# Patient Record
Sex: Male | Born: 2000 | Race: White | Hispanic: No | Marital: Single | Smoking: Current some day smoker
Health system: Southern US, Community
[De-identification: ages and names within clinical notes are randomized; demographics above are authoritative.]

## PROBLEM LIST (undated history)

## (undated) DIAGNOSIS — M419 Scoliosis, unspecified: Secondary | ICD-10-CM

## (undated) DIAGNOSIS — F32A Depression, unspecified: Secondary | ICD-10-CM

## (undated) DIAGNOSIS — Q8719 Other congenital malformation syndromes predominantly associated with short stature: Secondary | ICD-10-CM

## (undated) DIAGNOSIS — K219 Gastro-esophageal reflux disease without esophagitis: Secondary | ICD-10-CM

## (undated) DIAGNOSIS — F419 Anxiety disorder, unspecified: Secondary | ICD-10-CM

## (undated) DIAGNOSIS — F909 Attention-deficit hyperactivity disorder, unspecified type: Secondary | ICD-10-CM

## (undated) DIAGNOSIS — R5382 Chronic fatigue, unspecified: Secondary | ICD-10-CM

## (undated) DIAGNOSIS — G2581 Restless legs syndrome: Secondary | ICD-10-CM

## (undated) HISTORY — DX: Chronic fatigue, unspecified: R53.82

## (undated) HISTORY — DX: Scoliosis, unspecified: M41.9

## (undated) HISTORY — DX: Other congenital malformation syndromes predominantly associated with short stature: Q87.19

## (undated) HISTORY — DX: Depression, unspecified: F32.A

## (undated) HISTORY — DX: Restless legs syndrome: G25.81

## (undated) HISTORY — DX: Attention-deficit hyperactivity disorder, unspecified type: F90.9

## (undated) HISTORY — DX: Gastro-esophageal reflux disease without esophagitis: K21.9

## (undated) HISTORY — DX: Anxiety disorder, unspecified: F41.9

## (undated) HISTORY — PX: WISDOM TOOTH EXTRACTION: SHX21

---
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
## 2019-10-31 ENCOUNTER — Ambulatory Visit: Payer: Medicaid Other | Attending: Internal Medicine

## 2019-10-31 DIAGNOSIS — Z23 Encounter for immunization: Secondary | ICD-10-CM

## 2019-10-31 NOTE — Progress Notes (Signed)
   Covid-19 Vaccination Clinic  Name:  Scott Perkins    MRN: [id number] DOB: 2000/10/09  10/31/2019  Scott Perkins was observed post Covid-19 immunization for 15 minutes without incident. He was provided with Vaccine Information Sheet and instruction to access the V-Safe system.   Scott Perkins was instructed to call 911 with any severe reactions post vaccine: Marland Kitchen Difficulty breathing  . Swelling of face and throat  . A fast heartbeat  . A bad rash all over body  . Dizziness and weakness   Immunizations Administered    Name Date Dose VIS Date Route   Pfizer COVID-19 Vaccine 10/31/2019  9:02 AM 0.3 mL 08/13/2018 Intramuscular   Manufacturer: [employer], [employer]   Lot: [id number]   NDC: [address]-5

## 2019-11-25 ENCOUNTER — Ambulatory Visit: Payer: Medicaid Other | Attending: Internal Medicine

## 2019-11-25 DIAGNOSIS — Z23 Encounter for immunization: Secondary | ICD-10-CM

## 2019-11-25 NOTE — Progress Notes (Signed)
   Covid-19 Vaccination Clinic  Name:  Scott Perkins    MRN: [id number] DOB: 2000/09/07  11/25/2019  [name] was observed post Covid-19 immunization for 15 minutes without incident. He was provided with Vaccine Information Sheet and instruction to access the V-Safe system.   [name] was instructed to call 911 with any severe reactions post vaccine: Marland Kitchen Difficulty breathing  . Swelling of face and throat  . A fast heartbeat  . A bad rash all over body  . Dizziness and weakness   Immunizations Administered    Name Date Dose VIS Date Route   Pfizer COVID-19 Vaccine 11/25/2019 10:59 AM 0.3 mL 08/13/2018 Intramuscular   Manufacturer: [employer], [employer]   Lot: [id number]   NDC: [address]-2

## 2021-06-27 ENCOUNTER — Encounter: Payer: Self-pay | Admitting: Nurse Practitioner

## 2021-06-27 ENCOUNTER — Ambulatory Visit (INDEPENDENT_AMBULATORY_CARE_PROVIDER_SITE_OTHER): Payer: 59 | Admitting: Nurse Practitioner

## 2021-06-27 ENCOUNTER — Other Ambulatory Visit: Payer: Self-pay

## 2021-06-27 VITALS — BP 110/64 | HR 92 | Temp 97.9°F | Ht 67.2 in | Wt 94.6 lb

## 2021-06-27 DIAGNOSIS — Q8719 Other congenital malformation syndromes predominantly associated with short stature: Secondary | ICD-10-CM

## 2021-06-27 DIAGNOSIS — Z79899 Other long term (current) drug therapy: Secondary | ICD-10-CM

## 2021-06-27 DIAGNOSIS — F419 Anxiety disorder, unspecified: Secondary | ICD-10-CM | POA: Diagnosis not present

## 2021-06-27 DIAGNOSIS — F1290 Cannabis use, unspecified, uncomplicated: Secondary | ICD-10-CM

## 2021-06-27 DIAGNOSIS — F3289 Other specified depressive episodes: Secondary | ICD-10-CM

## 2021-06-27 DIAGNOSIS — F900 Attention-deficit hyperactivity disorder, predominantly inattentive type: Secondary | ICD-10-CM | POA: Diagnosis not present

## 2021-06-27 DIAGNOSIS — R21 Rash and other nonspecific skin eruption: Secondary | ICD-10-CM

## 2021-06-27 DIAGNOSIS — Z23 Encounter for immunization: Secondary | ICD-10-CM | POA: Diagnosis not present

## 2021-06-27 DIAGNOSIS — Z7689 Persons encountering health services in other specified circumstances: Secondary | ICD-10-CM

## 2021-06-27 MED ORDER — VALACYCLOVIR HCL 500 MG PO TABS: 500.0000 mg | ORAL_TABLET | Freq: Two times a day (BID) | ORAL | 0 refills | Status: AC

## 2021-06-27 MED ORDER — TETANUS-DIPHTH-ACELL PERTUSSIS 5-2.5-18.5 LF-MCG/0.5 IM SUSY
0.5000 mL | PREFILLED_SYRINGE | Freq: Once | INTRAMUSCULAR | Status: AC
  Administered 2021-06-27: 0.5 mL via INTRAMUSCULAR

## 2021-06-27 NOTE — Progress Notes (Signed)
I,Scott Perkins,acting as a Education administrator for Scott Brine, FNP.,have documented all relevant documentation on the behalf of Scott Brine, FNP,as directed by  Scott Brine, FNP while in the presence of Scott Perkins, Scott Perkins.   This visit occurred during the SARS-CoV-2 public health emergency.  Safety protocols were in place, including screening questions prior to the visit, additional usage of staff PPE, and extensive cleaning of exam room while observing appropriate contact time as indicated for disinfecting solutions.  Subjective:     Patient ID: Scott Perkins , male    DOB: August 16, 2000 , 21 y.o.   MRN: [id number]   Chief Complaint  Patient presents with   Establish Care   Rash   ADHD    HPI  Patient presents today to establish primary care. He has not seen a PCP in 2-3 years the last time was with a pediatrician Dr. Jaynie Crumble at [facility]. He is working at [employer] in [place]. His mother is a patient her M.H. single. No children.  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 he has problems with gaining weight, reports having a low appetite. He had seen a specialist at [facility] in the past. Diagnosed with ADHD Kindergarten or first grade, he had been given a blood thinner and blacked out then had problems with ADHD - adderal 10 mg XR, abilify and prozac. No longer seeing a psychiatrist due to provider moving to [place]. He continued his through [facility].   He would like to be seen for a rash and he also needs a refill on ADHD med.   Wt Readings from Last 3 Encounters: 06/27/21 : 94 lb 9.6 oz (42.9 kg)    No current sexual activity, last time was 2 years ago  Rash This is a chronic problem. The current episode started more than 1 year ago. The problem is unchanged. The affected locations include the left upper leg and right upper leg. The rash is characterized by redness, itchiness, scaling and burning. He was exposed to  nothing. Past treatments include nothing.    Past Medical History:  Diagnosis Date   ADHD (attention deficit hyperactivity disorder)    Anxiety    Chronic fatigue    Depression    GERD (gastroesophageal reflux disease)    Restless leg syndrome    Russell-Silver syndrome    Scoliosis      Family History  Problem Relation Age of Onset   Lupus Mother    Diabetes Maternal Grandmother    Fibromyalgia Maternal Grandmother      Current Outpatient Medications:    amphetamine-dextroamphetamine (ADDERALL XR) 10 MG 24 hr capsule, Adderall XR 10 mg capsule,extended release  TAKE ONE CAPSULE BY MOUTH QD, Disp: , Rfl:    ARIPiprazole (ABILIFY) 5 MG tablet, aripiprazole 5 mg tablet, Disp: , Rfl:    valACYclovir (VALTREX) 500 MG tablet, Take 1 tablet (500 mg total) by mouth 2 (two) times daily for 5 days., Disp: 10 tablet, Rfl: 0   FLUoxetine (PROZAC) 20 MG capsule, fluoxetine 20 mg capsule, Disp: 90 capsule, Rfl: 1   Not on File   Review of Systems  Constitutional: Negative.   Respiratory: Negative.    Cardiovascular: Negative.   Skin:  Positive for rash (from right thigh across to left thigh).  Psychiatric/Behavioral: Negative.      Today's Vitals   06/27/21 1535 06/27/21 1657  BP: 94/80 110/64  Pulse: 92   Temp: 97.9 F (36.6 C)  Weight: 94 lb 9.6 oz (42.9 kg)   Height: 5' 7.2" (1.707 m)   PainSc: 3    PainLoc: Back    Body mass index is 14.73 kg/m.   Objective:  Physical Exam Vitals reviewed.  Constitutional:      General: He is not in acute distress.    Appearance: Normal appearance.     Comments: Thin  Cardiovascular:     Rate and Rhythm: Normal rate.     Pulses: Normal pulses.     Heart sounds: Normal heart sounds. No murmur heard. Pulmonary:     Effort: Pulmonary effort is normal. No respiratory distress.     Breath sounds: Normal breath sounds. No wheezing.  Skin:    General: Skin is warm and dry.     Comments: Has ulcerations to right inner thigh and mid  area underneath scrotal area.   Neurological:     General: No focal deficit present.     Mental Status: He is alert and oriented to person, place, and time.     Cranial Nerves: No cranial nerve deficit.     Motor: No weakness.  Psychiatric:        Mood and Affect: Mood normal.        Behavior: Behavior normal.        Thought Content: Thought content normal.        Judgment: Judgment normal.        Assessment And Plan:     1. Rash and nonspecific skin eruption Comments: Ulcerations present to left inner buttocks and between groin under scrotum.  - CMP14+EGFR - HSV(herpes simplex vrs) 1+2 ab-IgG  2. Russell-Silver syndrome Comments: Reports having a thin and small stature, challenges with gaining weight. Stopped taking growth hormones at age 62.  3. Attention deficit hyperactivity disorder (ADHD), predominantly inattentive type Comments: Pending urine drug screen will start adderal. Will also refer to psychiatry due to reporting impulsive behavior.  - CMP14+EGFR - Drug Profile, Ur, 9 Drugs - Ambulatory referral to Psychiatry  4. Anxiety - Drug Profile, Ur, 9 Drugs  5. Other depression - Drug Profile, Ur, 9 Drugs  6. Marijuana use Comments: Admits to occasional use. I have advised him some of the medications for his mood/behaviors can be affected by this and encouraged to quit  7. Other long term (current) drug therapy  8. Encounter for immunization - Flu Vaccine QUAD 6+ mos PF IM (Fluarix Quad PF) - Tdap (BOOSTRIX) injection 0.5 mL  9. Establishing care with new doctor, encounter for     Patient was given opportunity to ask questions. Patient verbalized understanding of the plan and was able to repeat key elements of the plan. All questions were answered to their satisfaction.  Scott Brine, FNP   I, Scott Brine, FNP, have reviewed all documentation for this visit. The documentation on 06/28/21 for the exam, diagnosis, procedures, and orders are all accurate and  complete.   IF YOU HAVE BEEN REFERRED TO A SPECIALIST, IT MAY TAKE 1-2 WEEKS TO SCHEDULE/PROCESS THE REFERRAL. IF YOU HAVE NOT HEARD FROM US/SPECIALIST IN TWO WEEKS, PLEASE GIVE [place] A CALL AT [phone] X [phone].   THE PATIENT IS ENCOURAGED TO PRACTICE SOCIAL DISTANCING DUE TO THE COVID-19 PANDEMIC.

## 2021-06-28 ENCOUNTER — Encounter: Payer: Self-pay | Admitting: Nurse Practitioner

## 2021-06-28 LAB — CMP14+EGFR
ALT: 16 IU/L (ref 0–44)
AST: 23 IU/L (ref 0–40)
Albumin/Globulin Ratio: 2.3 — ABNORMAL HIGH (ref 1.2–2.2)
Albumin: 5.4 g/dL — ABNORMAL HIGH (ref 4.1–5.2)
Alkaline Phosphatase: 94 IU/L (ref 44–121)
BUN/Creatinine Ratio: 11 (ref 9–20)
BUN: 12 mg/dL (ref 6–20)
Bilirubin Total: 0.3 mg/dL (ref 0.0–1.2)
CO2: 23 mmol/L (ref 20–29)
Calcium: 10 mg/dL (ref 8.7–10.2)
Chloride: 106 mmol/L (ref 96–106)
Creatinine, Ser: 1.07 mg/dL (ref 0.76–1.27)
Globulin, Total: 2.4 g/dL (ref 1.5–4.5)
Glucose: 91 mg/dL (ref 70–99)
Potassium: 4.5 mmol/L (ref 3.5–5.2)
Sodium: 144 mmol/L (ref 134–144)
Total Protein: 7.8 g/dL (ref 6.0–8.5)
eGFR: 101 mL/min/{1.73_m2} (ref >59)

## 2021-06-28 LAB — HSV(HERPES SIMPLEX VRS) I + II AB-IGG
HSV 1 Glycoprotein G Ab, IgG: <0.91 index (ref 0.00–0.90)
HSV 2 IgG, Type Spec: <0.91 index (ref 0.00–0.90)

## 2021-06-28 MED ORDER — FLUOXETINE HCL 20 MG PO CAPS: ORAL_CAPSULE | ORAL | 1 refills | Status: DC

## 2021-06-30 ENCOUNTER — Other Ambulatory Visit: Payer: Self-pay

## 2021-06-30 MED ORDER — FLUOXETINE HCL 20 MG PO CAPS: 20.0000 mg | ORAL_CAPSULE | Freq: Every day | ORAL | 1 refills | Status: AC

## 2021-07-03 LAB — DRUG PROFILE, UR, 9 DRUGS (LABCORP)
Amphetamines, Urine: NEGATIVE ng/mL
Barbiturate Quant, Ur: NEGATIVE ng/mL
Benzodiazepine Quant, Ur: NEGATIVE ng/mL
Cannabinoid Quant, Ur: NEGATIVE
Cocaine (Metab.): NEGATIVE ng/mL
Methadone Screen, Urine: NEGATIVE ng/mL
Opiate Quant, Ur: NEGATIVE ng/mL
PCP Quant, Ur: NEGATIVE ng/mL
Propoxyphene: NEGATIVE ng/mL

## 2021-10-13 ENCOUNTER — Encounter: Payer: Self-pay | Admitting: Nurse Practitioner

## 2021-10-19 ENCOUNTER — Ambulatory Visit (INDEPENDENT_AMBULATORY_CARE_PROVIDER_SITE_OTHER): Payer: 59 | Admitting: Nurse Practitioner

## 2021-10-19 ENCOUNTER — Encounter: Payer: Self-pay | Admitting: Nurse Practitioner

## 2021-10-19 VITALS — BP 108/60 | HR 82 | Temp 98.2°F | Ht 67.2 in | Wt 101.0 lb

## 2021-10-19 DIAGNOSIS — F900 Attention-deficit hyperactivity disorder, predominantly inattentive type: Secondary | ICD-10-CM | POA: Diagnosis not present

## 2021-10-19 DIAGNOSIS — Z0000 Encounter for general adult medical examination without abnormal findings: Secondary | ICD-10-CM | POA: Diagnosis not present

## 2021-10-19 DIAGNOSIS — Z1159 Encounter for screening for other viral diseases: Secondary | ICD-10-CM

## 2021-10-19 DIAGNOSIS — Z114 Encounter for screening for human immunodeficiency virus [HIV]: Secondary | ICD-10-CM

## 2021-10-19 DIAGNOSIS — F419 Anxiety disorder, unspecified: Secondary | ICD-10-CM

## 2021-10-19 DIAGNOSIS — R21 Rash and other nonspecific skin eruption: Secondary | ICD-10-CM

## 2021-10-19 DIAGNOSIS — F3289 Other specified depressive episodes: Secondary | ICD-10-CM | POA: Diagnosis not present

## 2021-10-19 NOTE — Progress Notes (Signed)
?Industrial/product designer as a Education administrator for [employer], FNP.,have documented all relevant documentation on the behalf of Minette Brine, FNP,as directed by  Minette Brine, FNP while in the presence of Minette Brine, [place]. ? ?This visit occurred during the SARS-CoV-2 public health emergency.  Safety protocols were in place, including screening questions prior to the visit, additional usage of staff PPE, and extensive cleaning of exam room while observing appropriate contact time as indicated for disinfecting solutions. ? ?Subjective:  ?  ? Patient ID: Scott Perkins , male    DOB: October 06, 2000 , 21 y.o.   MRN: [id number] ? ? ?Chief Complaint  ?Patient presents with  ? Annual Exam  ? ? ?HPI ? ?Patient presents for HM. Has not seen any other providers. He is not exercising regularly. Regular diet, tries to avoid eating high amounts of junk food. He has not picked up his prozac for depression.  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 He has a full time job at [employer]. He feels like his stressors are less.  ? ?Wt Readings from Last 3 Encounters: ?10/19/21 : 101 lb (45.8 kg) ?06/27/21 : 94 lb 9.6 oz (42.9 kg) ? ? ? ?  ? ?Past Medical History:  ?Diagnosis Date  ? ADHD (attention deficit hyperactivity disorder)   ? Anxiety   ? Chronic fatigue   ? Depression   ? GERD (gastroesophageal reflux disease)   ? Restless leg syndrome   ? Russell-Silver syndrome   ? Scoliosis   ?  ? ?Family History  ?Problem Relation Age of Onset  ? Lupus Mother   ? Diabetes Maternal Grandmother   ? Fibromyalgia Maternal Grandmother   ? ? ? ?Current Outpatient Medications:  ?  FLUoxetine (PROZAC) 20 MG capsule, Take 1 capsule (20 mg total) by mouth daily. fluoxetine 20 mg capsule, Disp: 90 capsule, Rfl: 1  ? ?Allergies  ?Allergen Reactions  ? Sudafed [Pseudoephedrine]   ?  ? ?Review of Systems  ?Constitutional: Negative.   ?HENT: Negative.    ?Eyes: Negative.   ?Respiratory: Negative.    ?Cardiovascular: Negative.   ?Gastrointestinal: Negative.    ?Endocrine: Negative.   ?Genitourinary: Negative.   ?Musculoskeletal: Negative.   ?Skin: Negative.   ?Allergic/Immunologic: Negative.   ?Neurological: Negative.   ?Hematological: Negative.   ?Psychiatric/Behavioral: Negative.     ? ?Today's Vitals  ? 10/19/21 1504  ?BP: 108/60  ?Pulse: 82  ?Temp: 98.2 ?F (36.8 ?C)  ?TempSrc: Oral  ?Weight: 101 lb (45.8 kg)  ?Height: 5' 7.2" (1.707 m)  ? ?Body mass index is 15.72 kg/m?.  ?Wt Readings from Last 3 Encounters:  ?10/19/21 101 lb (45.8 kg)  ?06/27/21 94 lb 9.6 oz (42.9 kg)  ? ? ?Objective:  ?Physical Exam ?Vitals reviewed.  ?Constitutional:   ?   General: He is not in acute distress. ?   Appearance: Normal appearance.  ?   Comments: Thin  ?HENT:  ?   Head: Normocephalic and atraumatic.  ?   Right Ear: Tympanic membrane, ear canal and external ear normal. There is no impacted cerumen.  ?   Left Ear: Tympanic membrane, ear canal and external ear normal. There is no impacted cerumen.  ?   Nose: Nose normal.  ?   Mouth/Throat:  ?   Mouth: Mucous membranes are moist.  ?Eyes:  ?   Pupils: Pupils are equal, round, and reactive to light.  ?Cardiovascular:  ?   Rate and Rhythm: Normal rate and regular rhythm.  ?   Pulses: Normal pulses.  ?  Heart sounds: Normal heart sounds. No murmur heard. ?Pulmonary:  ?   Effort: Pulmonary effort is normal. No respiratory distress.  ?   Breath sounds: Normal breath sounds. No wheezing.  ?Abdominal:  ?   General: Abdomen is flat. Bowel sounds are normal. There is no distension.  ?   Palpations: Abdomen is soft.  ?Musculoskeletal:     ?   General: No swelling or tenderness. Normal range of motion.  ?   Cervical back: Normal range of motion and neck supple.  ?Skin: ?   General: Skin is warm and dry.  ?   Capillary Refill: Capillary refill takes less than 2 seconds.  ?   Comments: Has ulcerations to right inner thigh and mid area underneath scrotal area.   ?Neurological:  ?   General: No focal deficit present.  ?   Mental Status: He is alert and  oriented to person, place, and time.  ?   Cranial Nerves: No cranial nerve deficit.  ?   Motor: No weakness.  ?Psychiatric:     ?   Mood and Affect: Mood normal.     ?   Behavior: Behavior normal.     ?   Thought Content: Thought content normal.     ?   Judgment: Judgment normal.  ?  ? ?   ?Assessment And Plan:  ?   ?1. Encounter for annual physical exam ?Behavior modifications discussed and diet history reviewed.   ?Pt will continue to exercise regularly and modify diet with low GI, plant based foods and decrease intake of processed foods.  ?Recommend intake of daily multivitamin, Vitamin D, and calcium.  ?Recommend for preventive screenings, as well as recommend immunizations that include influenza, TDAP ?- Lipid panel ?- CBC ? ?2. Encounter for hepatitis C screening test for low risk patient ?Will check Hepatitis C screening due to recent recommendations to screen all adults 18 years and older ?- Hepatitis C antibody ? ?3. Screening for HIV without presence of risk factors ?- HIV Antibody (routine testing w rflx) ? ?4. Attention deficit hyperactivity disorder (ADHD), predominantly inattentive type ?Comments: He is not currently taking any medications.  ? ?5. Other depression ?Comments: Has not picked up the Prozac, he feels like he is doing good right now ?- CMP14+EGFR ? ?6. Anxiety ?Comments: Encouraged to take the fluoxetine.  ? ?7. Rash and nonspecific skin eruption ?Comments: Continues to have rash to scrotal area, will refer to Dermatology ?- Ambulatory referral to Dermatology ?  ? ? ?Patient was given opportunity to ask questions. Patient verbalized understanding of the plan and was able to repeat key elements of the plan. All questions were answered to their satisfaction.  ?Minette Brine, FNP  ? ?I, Minette Brine, FNP, have reviewed all documentation for this visit. The documentation on 10/19/21 for the exam, diagnosis, procedures, and orders are all accurate and complete.  ? ?IF YOU HAVE BEEN REFERRED TO A  SPECIALIST, IT MAY TAKE 1-2 WEEKS TO SCHEDULE/PROCESS THE REFERRAL. IF YOU HAVE NOT HEARD FROM US/SPECIALIST IN TWO WEEKS, PLEASE GIVE [place] A CALL AT [phone] X [phone].  ? ?THE PATIENT IS ENCOURAGED TO PRACTICE SOCIAL DISTANCING DUE TO THE COVID-19 PANDEMIC.   ?

## 2021-10-19 NOTE — Patient Instructions (Addendum)
Health Maintenance, Male ?Adopting a healthy lifestyle and getting preventive care are important in promoting health and wellness. Ask your health care provider about: ?The right schedule for you to have regular tests and exams. ?Things you can do on your own to prevent diseases and keep yourself healthy. ?What should I know about diet, weight, and exercise? ?Eat a healthy diet ? ?Eat a diet that includes plenty of vegetables, fruits, low-fat dairy products, and lean protein. ?Do not eat a lot of foods that are high in solid fats, added sugars, or sodium. ?Maintain a healthy weight ?Body mass index (BMI) is a measurement that can be used to identify possible weight problems. It estimates body fat based on height and weight. Your health care provider can help determine your BMI and help you achieve or maintain a healthy weight. ?Get regular exercise ?Get regular exercise. This is one of the most important things you can do for your health. Most adults should: ?Exercise for at least 150 minutes each week. The exercise should increase your heart rate and make you sweat (moderate-intensity exercise). ?Do strengthening exercises at least twice a week. This is in addition to the moderate-intensity exercise. ?Spend less time sitting. Even light physical activity can be beneficial. ?Watch cholesterol and blood lipids ?Have your blood tested for lipids and cholesterol at 23 years of age, then have this test every 5 years. ?You may need to have your cholesterol levels checked more often if: ?Your lipid or cholesterol levels are high. ?You are older than 23 years of age. ?You are at high risk for heart disease. ?What should I know about cancer screening? ?Many types of cancers can be detected early and may often be prevented. Depending on your health history and family history, you may need to have cancer screening at various ages. This may include screening for: ?Colorectal cancer. ?Prostate cancer. ?Skin cancer. ?Lung  cancer. ?What should I know about heart disease, diabetes, and high blood pressure? ?Blood pressure and heart disease ?High blood pressure causes heart disease and increases the risk of stroke. This is more likely to develop in people who have high blood pressure readings or are overweight. ?Talk with your health care provider about your target blood pressure readings. ?Have your blood pressure checked: ?Every 3-5 years if you are 42-43 years of age. ?Every year if you are 47 years old or older. ?If you are between the ages of 11 and 57 and are a current or former smoker, ask your health care provider if you should have a one-time screening for abdominal aortic aneurysm (AAA). ?Diabetes ?Have regular diabetes screenings. This checks your fasting blood sugar level. Have the screening done: ?Once every three years after age 8 if you are at a normal weight and have a low risk for diabetes. ?More often and at a younger age if you are overweight or have a high risk for diabetes. ?What should I know about preventing infection? ?Hepatitis B ?If you have a higher risk for hepatitis B, you should be screened for this virus. Talk with your health care provider to find out if you are at risk for hepatitis B infection. ?Hepatitis C ?Blood testing is recommended for: ?Everyone born from [birth year] through 1967. ?Anyone with known risk factors for hepatitis C. ?Sexually transmitted infections (STIs) ?You should be screened each year for STIs, including gonorrhea and chlamydia, if: ?You are sexually active and are younger than 23 years of age. ?You are older than 23 years of age and your  health care provider tells you that you are at risk for this type of infection. ?Your sexual activity has changed since you were last screened, and you are at increased risk for chlamydia or gonorrhea. Ask your health care provider if you are at risk. ?Ask your health care provider about whether you are at high risk for HIV. Your health care provider  may recommend a prescription medicine to help prevent HIV infection. If you choose to take medicine to prevent HIV, you should first get tested for HIV. You should then be tested every 3 months for as long as you are taking the medicine. ?Follow these instructions at home: ?Alcohol use ?Do not drink alcohol if your health care provider tells you not to drink. ?If you drink alcohol: ?Limit how much you have to 0-2 drinks a day. ?Know how much alcohol is in your drink. In the [place] one drink equals one 12 oz bottle of beer (355 mL), one 5 oz glass of wine (148 mL), or one 1? oz glass of hard liquor (44 mL). ?Lifestyle ?Do not use any products that contain nicotine or tobacco. These products include cigarettes, chewing tobacco, and vaping devices, such as e-cigarettes. If you need help quitting, ask your health care provider. ?Do not use street drugs. ?Do not share needles. ?Ask your health care provider for help if you need support or information about quitting drugs. ?General instructions ?Schedule regular health, dental, and eye exams. ?Stay current with your vaccines. ?Tell your health care provider if: ?You often feel depressed. ?You have ever been abused or do not feel safe at home. ?Summary ?Adopting a healthy lifestyle and getting preventive care are important in promoting health and wellness. ?Follow your health care provider's instructions about healthy diet, exercising, and getting tested or screened for diseases. ?Follow your health care provider's instructions on monitoring your cholesterol and blood pressure. ?This information is not intended to replace advice given to you by your health care provider. Make sure you discuss any questions you have with your health care provider. ?Document Revised: 10/25/2020 Document Reviewed: 10/25/2020 ?Elsevier Patient Education ? 2023 Elsevier Inc. ? ? ?[facility] ?[address] ?[address] ?[place], [place]  [address] ?[phone] ?

## 2021-10-20 LAB — CMP14+EGFR
ALT: 13 IU/L (ref 0–44)
AST: 17 IU/L (ref 0–40)
Albumin/Globulin Ratio: 2.5 — ABNORMAL HIGH (ref 1.2–2.2)
Albumin: 4.8 g/dL (ref 4.1–5.2)
Alkaline Phosphatase: 75 IU/L (ref 44–121)
BUN/Creatinine Ratio: 11 (ref 9–20)
BUN: 11 mg/dL (ref 6–20)
Bilirubin Total: <0.2 mg/dL (ref 0.0–1.2)
CO2: 24 mmol/L (ref 20–29)
Calcium: 9.6 mg/dL (ref 8.7–10.2)
Chloride: 105 mmol/L (ref 96–106)
Creatinine, Ser: 0.96 mg/dL (ref 0.76–1.27)
Globulin, Total: 1.9 g/dL (ref 1.5–4.5)
Glucose: 85 mg/dL (ref 70–99)
Potassium: 3.9 mmol/L (ref 3.5–5.2)
Sodium: 144 mmol/L (ref 134–144)
Total Protein: 6.7 g/dL (ref 6.0–8.5)
eGFR: 115 mL/min/{1.73_m2} (ref >59)

## 2021-10-20 LAB — CBC
Hematocrit: 43.6 % (ref 37.5–51.0)
Hemoglobin: 15.2 g/dL (ref 13.0–17.7)
MCH: 31.4 pg (ref 26.6–33.0)
MCHC: 34.9 g/dL (ref 31.5–35.7)
MCV: 90 fL (ref 79–97)
Platelets: 284 10*3/uL (ref 150–450)
RBC: 4.84 x10E6/uL (ref 4.14–5.80)
RDW: 11.9 % (ref 11.6–15.4)
WBC: 6.7 10*3/uL (ref 3.4–10.8)

## 2021-10-20 LAB — HEPATITIS C ANTIBODY: Hep C Virus Ab: NONREACTIVE

## 2021-10-20 LAB — HIV ANTIBODY (ROUTINE TESTING W REFLEX): HIV Screen 4th Generation wRfx: NONREACTIVE

## 2021-10-20 LAB — LIPID PANEL
Chol/HDL Ratio: 2.8 ratio (ref 0.0–5.0)
Cholesterol, Total: 181 mg/dL (ref 100–199)
HDL: 65 mg/dL (ref >39)
LDL Chol Calc (NIH): 98 mg/dL (ref 0–99)
Triglycerides: 101 mg/dL (ref 0–149)
VLDL Cholesterol Cal: 18 mg/dL (ref 5–40)

## 2022-03-20 ENCOUNTER — Ambulatory Visit: Payer: BLUE CROSS/BLUE SHIELD | Admitting: Dermatology

## 2022-04-24 ENCOUNTER — Ambulatory Visit: Payer: 59 | Admitting: Nurse Practitioner

## 2022-06-16 ENCOUNTER — Emergency Department
Admission: EM | Admit: 2022-06-16 | Discharge: 2022-06-16 | Disposition: A | Discharge status: Home / Self Care | Payer: BLUE CROSS/BLUE SHIELD | Attending: Emergency Medicine | Admitting: Emergency Medicine

## 2022-06-16 ENCOUNTER — Other Ambulatory Visit: Payer: Self-pay

## 2022-06-16 DIAGNOSIS — R42 Dizziness and giddiness: Secondary | ICD-10-CM | POA: Diagnosis not present

## 2022-06-16 DIAGNOSIS — R55 Syncope and collapse: Secondary | ICD-10-CM | POA: Insufficient documentation

## 2022-06-16 LAB — CBC
HCT: 44 % (ref 39.0–52.0)
Hemoglobin: 14.6 g/dL (ref 13.0–17.0)
MCH: 30.7 pg (ref 26.0–34.0)
MCHC: 33.2 g/dL (ref 30.0–36.0)
MCV: 92.4 fL (ref 80.0–100.0)
Platelets: 283 10*3/uL (ref 150–400)
RBC: 4.76 MIL/uL (ref 4.22–5.81)
RDW: 11.9 % (ref 11.5–15.5)
WBC: 7 10*3/uL (ref 4.0–10.5)
nRBC: 0 % (ref 0.0–0.2)

## 2022-06-16 LAB — URINALYSIS, ROUTINE W REFLEX MICROSCOPIC
Bilirubin Urine: NEGATIVE
Glucose, UA: NEGATIVE mg/dL
Hgb urine dipstick: NEGATIVE
Ketones, ur: NEGATIVE mg/dL
Leukocytes,Ua: NEGATIVE
Nitrite: NEGATIVE
Protein, ur: NEGATIVE mg/dL
Specific Gravity, Urine: 1.015 (ref 1.005–1.030)
pH: 6 (ref 5.0–8.0)

## 2022-06-16 LAB — BASIC METABOLIC PANEL
Anion gap: 6 (ref 5–15)
BUN: 15 mg/dL (ref 6–20)
CO2: 26 mmol/L (ref 22–32)
Calcium: 9.6 mg/dL (ref 8.9–10.3)
Chloride: 108 mmol/L (ref 98–111)
Creatinine, Ser: 0.85 mg/dL (ref 0.61–1.24)
GFR, Estimated: >60 mL/min (ref >60)
Glucose, Bld: 103 mg/dL — ABNORMAL HIGH (ref 70–99)
Potassium: 4 mmol/L (ref 3.5–5.1)
Sodium: 140 mmol/L (ref 135–145)

## 2022-06-16 LAB — CBG MONITORING, ED: Glucose-Capillary: 97 mg/dL (ref 70–99)

## 2022-06-16 NOTE — ED Triage Notes (Signed)
Pt to ED via POV for syncopal episode last night. Pt states that he stood up to go help his grandfather and had syncopal episode. Pt reports that he has had near syncopal episodes in the past but has not actually had LOC. Pt reports that today while at work he was having dizziness so he went to Urgent care and was sent here for evaluation. Pt is in NAD at this time.

## 2022-06-16 NOTE — ED Provider Notes (Signed)
[facility] Provider Note    Event Date/Time   First MD Initiated Contact with Patient 06/16/22 1108     (approximate)   History   Chief Complaint Loss of Consciousness   HPI Scott Perkins is a 21 y.o. male, history of ADHD, depression, restless leg syndrome, anxiety, Russell-Silver syndrome, chronic fatigue, presents to the emergency department for evaluation of syncope.  He states that he stood up to go help his grandfather quickly last night, when he felt lightheaded immediately after standing.  He states that soon after, he felt "warm all over" and began to feel really dizzy like he was going to pass out.  At work later that day, he continued to feel episodes of dizziness.  He went to urgent care initially, who referred him to the emergency department for further evaluation.  Denies chest pain, shortness of breath, abdominal pain, leg swelling, nausea/vomiting, diarrhea, urinary symptoms, rash/lesions, numbness/tingling in upper or lower extremities, or vertigo.  He states that the symptoms have happened to him in the past, however never this severe.  History Limitations: No limitations.        Physical Exam  Triage Vital Signs: ED Triage Vitals [06/16/22 0957]  Enc Vitals Group     BP 112/73     Pulse Rate 72     Resp 16     Temp 98.1 F (36.7 C)     Temp Source Oral     SpO2 97 %     Weight 96 lb (43.5 kg)     Height 5\' 8"  (1.727 m)     Head Circumference      Peak Flow      Pain Score 3     Pain Loc      Pain Edu?      Excl. in GC?     Most recent vital signs: Vitals:   06/16/22 0957  BP: 112/73  Pulse: 72  Resp: 16  Temp: 98.1 F (36.7 C)  SpO2: 97%    General: Awake, NAD.  Skin: Warm, dry. No rashes or lesions.  Eyes: PERRL. Conjunctivae normal.  CV: Good peripheral perfusion.  S1 and S2 present.  Mild systolic murmur. Resp: Normal effort.  Lung sounds are clear bilaterally. Abd: Soft, non-tender. No distention.   Neuro: At baseline. No gross neurological deficits.  Musculoskeletal: Normal ROM of all extremities.  Physical Exam    ED Results / Procedures / Treatments  Labs (all labs ordered are listed, but only abnormal results are displayed) Labs Reviewed  BASIC METABOLIC PANEL - Abnormal; Notable for the following components:      Result Value   Glucose, Bld 103 (*)    All other components within normal limits  URINALYSIS, ROUTINE W REFLEX MICROSCOPIC - Abnormal; Notable for the following components:   Color, Urine STRAW (*)    APPearance CLEAR (*)    All other components within normal limits  CBC  CBG MONITORING, ED     EKG Sinus rhythm, rate of 65, unusual P axis with possible ectopic atrial rhythm, normal QRS, no QT prolongation, no ST segment changes.   RADIOLOGY  ED Provider Interpretation: N/A.  No results found.  PROCEDURES:  Critical Care performed: N/A.  Procedures    MEDICATIONS ORDERED IN ED: Medications - No data to display   IMPRESSION / MDM / ASSESSMENT AND PLAN / ED COURSE  I reviewed the triage vital signs and the nursing notes.  Differential diagnosis includes, but is not limited to, arrhythmia, pulmonary embolism, hypoglycemia, vasovagal syncope, dehydration.  ED Course Patient appears well, vitals within normal limits.  NAD.  Currently asymptomatic.  CBC shows no cytosis or anemia.  BMP shows no electrolyte abnormalities or AKI.  Urinalysis shows no evidence of infection.  No evidence of hypoglycemia.  Assessment/Plan Patient presents with near syncope/dizziness.  He appears well clinically at this time.  Physical exam is unremarkable.  No gross neurological deficits.  He does have a mild systolic murmur, otherwise no signs of cardiac abnormalities.  EKG is unremarkable with the exception of unusual P axis.  Given the history, I suspect likely orthostatic hypotension versus vasovagal syncope.  Very low suspicion for  any serious or life-threatening pathology.  He is PERC negative.  Lab workup is reassuring.  Given his murmur and slightly abnormal EKG findings, I department they follow-up with cardiology.  He states that he will follow-up with his PCP and cardiology at [facility].  Will discharge.  Considered admission for this patient, but given his stable presentation and unremarkable workup, is unlikely benefit from admission.  Provided the patient with anticipatory guidance, return precautions, and educational material. Encouraged the patient to return to the emergency department at any time if they begin to experience any new or worsening symptoms. Patient expressed understanding and agreed with the plan.   Patient's presentation is most consistent with acute complicated illness / injury requiring diagnostic workup.       FINAL CLINICAL IMPRESSION(S) / ED DIAGNOSES   Final diagnoses:  Syncope, unspecified syncope type     Rx / DC Orders   ED Discharge Orders     None        Note:  This document was prepared using Dragon voice recognition software and may include unintentional dictation errors.   Varney Daily, [place] 06/16/22 Willette Alma    Sharyn Creamer, MD 06/17/22 [phone]

## 2022-06-16 NOTE — ED Notes (Signed)
Woke up suddenly last night because heheard his grandfather fell.  He got up quickly and he passed out.  When he woke up he feltvery woozy.   This am he is still feeling lightheaded.  Sent here from Urgent care.

## 2022-06-16 NOTE — Discharge Instructions (Addendum)
-  Please follow up with your PCP/Cardiologist as discussed for further evaluation.  -Please review the educational material provided.  -Return to the emergency department at any time if you begin to experience any new or worsening symptoms.

## 2022-10-25 ENCOUNTER — Encounter: Payer: 59 | Admitting: Nurse Practitioner
